# Patient Record
Sex: Female | Born: 1935 | Race: White | Hispanic: No | Marital: Married
Health system: Southern US, Community
[De-identification: ages and names within clinical notes are randomized; demographics above are authoritative.]

---

## 2003-05-26 ENCOUNTER — Other Ambulatory Visit: Payer: Self-pay

## 2004-02-01 ENCOUNTER — Ambulatory Visit: Payer: Self-pay | Admitting: Oncology

## 2004-05-04 ENCOUNTER — Ambulatory Visit: Payer: Self-pay

## 2004-05-09 ENCOUNTER — Ambulatory Visit: Payer: Self-pay | Admitting: Oncology

## 2004-05-27 ENCOUNTER — Ambulatory Visit: Payer: Self-pay | Admitting: General Surgery

## 2004-11-07 ENCOUNTER — Ambulatory Visit: Payer: Self-pay | Admitting: Oncology

## 2004-11-22 ENCOUNTER — Ambulatory Visit: Payer: Self-pay

## 2004-12-08 ENCOUNTER — Ambulatory Visit: Payer: Self-pay | Admitting: Ophthalmology

## 2004-12-13 ENCOUNTER — Ambulatory Visit: Payer: Self-pay | Admitting: Ophthalmology

## 2005-05-09 ENCOUNTER — Ambulatory Visit: Payer: Self-pay | Admitting: Oncology

## 2005-05-18 ENCOUNTER — Ambulatory Visit: Payer: Self-pay | Admitting: Oncology

## 2005-06-15 ENCOUNTER — Ambulatory Visit: Payer: Self-pay | Admitting: Oncology

## 2005-11-28 ENCOUNTER — Ambulatory Visit: Payer: Self-pay | Admitting: Oncology

## 2006-01-03 ENCOUNTER — Ambulatory Visit: Payer: Self-pay | Admitting: General Practice

## 2006-01-03 ENCOUNTER — Other Ambulatory Visit: Payer: Self-pay

## 2006-01-05 ENCOUNTER — Ambulatory Visit: Payer: Self-pay | Admitting: General Practice

## 2006-05-29 ENCOUNTER — Ambulatory Visit: Payer: Self-pay | Admitting: Oncology

## 2006-08-08 ENCOUNTER — Ambulatory Visit: Payer: Self-pay | Admitting: Oncology

## 2006-10-18 ENCOUNTER — Ambulatory Visit: Payer: Self-pay | Admitting: Internal Medicine

## 2006-10-20 ENCOUNTER — Ambulatory Visit: Payer: Self-pay | Admitting: Emergency Medicine

## 2006-12-17 ENCOUNTER — Ambulatory Visit: Payer: Self-pay | Admitting: Oncology

## 2007-01-11 ENCOUNTER — Ambulatory Visit: Payer: Self-pay | Admitting: Oncology

## 2007-01-16 ENCOUNTER — Ambulatory Visit: Payer: Self-pay | Admitting: Oncology

## 2007-02-02 ENCOUNTER — Ambulatory Visit: Payer: Self-pay | Admitting: Internal Medicine

## 2007-07-17 ENCOUNTER — Ambulatory Visit: Payer: Self-pay | Admitting: Oncology

## 2007-07-18 ENCOUNTER — Ambulatory Visit: Payer: Self-pay | Admitting: Oncology

## 2007-08-16 ENCOUNTER — Ambulatory Visit: Payer: Self-pay | Admitting: Oncology

## 2007-12-10 ENCOUNTER — Encounter: Admission: RE | Admit: 2007-12-10 | Discharge: 2007-12-10 | Payer: Self-pay | Admitting: Neurology

## 2007-12-24 ENCOUNTER — Ambulatory Visit: Payer: Self-pay | Admitting: Oncology

## 2008-01-16 ENCOUNTER — Ambulatory Visit: Payer: Self-pay | Admitting: Oncology

## 2008-02-14 ENCOUNTER — Ambulatory Visit: Payer: Self-pay | Admitting: Oncology

## 2009-03-14 ENCOUNTER — Ambulatory Visit: Payer: Self-pay | Admitting: Internal Medicine

## 2009-04-15 ENCOUNTER — Ambulatory Visit: Payer: Self-pay | Admitting: Oncology

## 2009-04-17 ENCOUNTER — Ambulatory Visit: Payer: Self-pay | Admitting: Oncology

## 2009-05-06 ENCOUNTER — Ambulatory Visit: Payer: Self-pay | Admitting: Oncology

## 2009-05-18 ENCOUNTER — Ambulatory Visit: Payer: Self-pay | Admitting: Oncology

## 2009-11-12 ENCOUNTER — Ambulatory Visit: Payer: Self-pay

## 2009-11-28 ENCOUNTER — Ambulatory Visit: Payer: Self-pay | Admitting: Internal Medicine

## 2010-02-17 ENCOUNTER — Ambulatory Visit: Payer: Self-pay

## 2010-04-19 ENCOUNTER — Ambulatory Visit: Payer: Self-pay | Admitting: Oncology

## 2011-03-15 ENCOUNTER — Inpatient Hospital Stay: Payer: Self-pay | Admitting: Internal Medicine

## 2011-03-18 ENCOUNTER — Ambulatory Visit: Payer: Self-pay | Admitting: Internal Medicine

## 2011-04-18 ENCOUNTER — Ambulatory Visit: Payer: Self-pay | Admitting: Internal Medicine

## 2011-04-26 ENCOUNTER — Ambulatory Visit: Payer: Self-pay | Admitting: Surgery

## 2011-05-16 ENCOUNTER — Inpatient Hospital Stay: Payer: Self-pay | Admitting: Internal Medicine

## 2011-05-16 LAB — URINALYSIS, COMPLETE
Bilirubin,UR: NEGATIVE
Blood: NEGATIVE
Hyaline Cast: 1
Ph: 5 (ref 4.5–8.0)
Protein: NEGATIVE
RBC,UR: <1 /HPF (ref 0–5)

## 2011-05-16 LAB — CBC WITH DIFFERENTIAL/PLATELET
Eosinophil %: 0 %
Lymphocyte %: 4.2 %
Monocyte %: 6.9 %
Neutrophil %: 88.8 %
Platelet: 280 10*3/uL (ref 150–440)
RBC: 4.29 10*6/uL (ref 3.80–5.20)
WBC: 28.3 10*3/uL — ABNORMAL HIGH (ref 3.6–11.0)

## 2011-05-16 LAB — BASIC METABOLIC PANEL
Anion Gap: 9 (ref 7–16)
BUN: 12 mg/dL (ref 7–18)
Co2: 31 mmol/L (ref 21–32)
EGFR (Non-African Amer.): 54 — ABNORMAL LOW
Glucose: 105 mg/dL — ABNORMAL HIGH (ref 65–99)

## 2011-05-17 LAB — CBC WITH DIFFERENTIAL/PLATELET
Basophil #: 0.1 10*3/uL (ref 0.0–0.1)
Eosinophil #: 0.2 10*3/uL (ref 0.0–0.7)
Eosinophil %: 1.4 %
Lymphocyte #: 1 10*3/uL (ref 1.0–3.6)
MCH: 29 pg (ref 26.0–34.0)
MCHC: 32.4 g/dL (ref 32.0–36.0)
Monocyte #: 0.9 10*3/uL — ABNORMAL HIGH (ref 0.0–0.7)
Neutrophil %: 86.8 %
Platelet: 227 10*3/uL (ref 150–440)
RBC: 3.65 10*6/uL — ABNORMAL LOW (ref 3.80–5.20)
RDW: 15.2 % — ABNORMAL HIGH (ref 11.5–14.5)

## 2011-05-17 LAB — LIPID PANEL
Cholesterol: 79 mg/dL (ref 0–200)
HDL Cholesterol: 20 mg/dL — ABNORMAL LOW (ref 40–60)
VLDL Cholesterol, Calc: 10 mg/dL (ref 5–40)

## 2011-05-17 LAB — BASIC METABOLIC PANEL
Anion Gap: 9 (ref 7–16)
BUN: 9 mg/dL (ref 7–18)
Calcium, Total: 7.1 mg/dL — ABNORMAL LOW (ref 8.5–10.1)
Chloride: 109 mmol/L — ABNORMAL HIGH (ref 98–107)
Co2: 25 mmol/L (ref 21–32)
Osmolality: 282 (ref 275–301)
Potassium: 2.5 mmol/L — CL (ref 3.5–5.1)

## 2011-05-17 LAB — MAGNESIUM: Magnesium: 1.4 mg/dL — ABNORMAL LOW

## 2011-05-18 LAB — COMPREHENSIVE METABOLIC PANEL
Albumin: 1.6 g/dL — ABNORMAL LOW (ref 3.4–5.0)
Alkaline Phosphatase: 54 U/L (ref 50–136)
Anion Gap: 9 (ref 7–16)
BUN: 6 mg/dL — ABNORMAL LOW (ref 7–18)
Calcium, Total: 7 mg/dL — CL (ref 8.5–10.1)
Glucose: 75 mg/dL (ref 65–99)
SGOT(AST): 15 U/L (ref 15–37)
SGPT (ALT): 9 U/L — ABNORMAL LOW
Total Protein: 4.2 g/dL — ABNORMAL LOW (ref 6.4–8.2)

## 2011-05-18 LAB — CBC WITH DIFFERENTIAL/PLATELET
Basophil #: 0.1 x10 3/mm 3
Basophil %: 0.8 %
Eosinophil #: 0.4 x10 3/mm 3
Eosinophil %: 3.7 %
HCT: 33.9 % — ABNORMAL LOW
HGB: 11.1 g/dL — ABNORMAL LOW
Lymphocyte %: 8.2 %
Lymphs Abs: 0.9 x10 3/mm 3 — ABNORMAL LOW
MCH: 29.2 pg
MCHC: 32.7 g/dL
MCV: 90 fL
Monocyte #: 0.7 x10 3/mm 3
Monocyte %: 6.4 %
Neutrophil #: 9.3 x10 3/mm 3 — ABNORMAL HIGH
Neutrophil %: 80.9 %
Platelet: 271 x10 3/mm 3
RBC: 3.79 X10 6/mm 3 — ABNORMAL LOW
RDW: 15.2 % — ABNORMAL HIGH
WBC: 11.5 x10 3/mm 3 — ABNORMAL HIGH

## 2011-05-18 LAB — PROT IMMUNOELECTROPHORES(ARMC)

## 2011-05-19 ENCOUNTER — Ambulatory Visit: Payer: Self-pay | Admitting: Internal Medicine

## 2011-05-19 LAB — CBC WITH DIFFERENTIAL/PLATELET
Basophil #: 0.3 10*3/uL — ABNORMAL HIGH (ref 0.0–0.1)
Eosinophil %: 4.4 %
HCT: 33.6 % — ABNORMAL LOW (ref 35.0–47.0)
Lymphocyte #: 0.9 10*3/uL — ABNORMAL LOW (ref 1.0–3.6)
Lymphocyte %: 9.6 %
MCV: 89 fL (ref 80–100)
Monocyte %: 5.5 %
Neutrophil #: 7.4 10*3/uL — ABNORMAL HIGH (ref 1.4–6.5)
RBC: 3.77 10*6/uL — ABNORMAL LOW (ref 3.80–5.20)
RDW: 15.4 % — ABNORMAL HIGH (ref 11.5–14.5)
WBC: 9.6 10*3/uL (ref 3.6–11.0)

## 2011-05-19 LAB — BASIC METABOLIC PANEL
Anion Gap: 12 (ref 7–16)
BUN: 2 mg/dL — ABNORMAL LOW (ref 7–18)
Chloride: 112 mmol/L — ABNORMAL HIGH (ref 98–107)
Co2: 22 mmol/L (ref 21–32)
Creatinine: 0.53 mg/dL — ABNORMAL LOW (ref 0.60–1.30)
EGFR (African American): >60
EGFR (Non-African Amer.): >60
Potassium: 3 mmol/L — ABNORMAL LOW (ref 3.5–5.1)
Sodium: 146 mmol/L — ABNORMAL HIGH (ref 136–145)

## 2011-05-19 LAB — MAGNESIUM: Magnesium: 1.4 mg/dL — ABNORMAL LOW

## 2011-05-20 LAB — MAGNESIUM: Magnesium: 1.5 mg/dL — ABNORMAL LOW

## 2011-05-20 LAB — CBC WITH DIFFERENTIAL/PLATELET
Basophil #: 0.3 10*3/uL — ABNORMAL HIGH (ref 0.0–0.1)
Basophil %: 3.5 %
Eosinophil #: 0.5 10*3/uL (ref 0.0–0.7)
Lymphocyte #: 1.2 10*3/uL (ref 1.0–3.6)
MCHC: 32.6 g/dL (ref 32.0–36.0)
Monocyte %: 7.6 %
Neutrophil #: 5.2 10*3/uL (ref 1.4–6.5)
RDW: 15.5 % — ABNORMAL HIGH (ref 11.5–14.5)

## 2011-05-20 LAB — BASIC METABOLIC PANEL
Anion Gap: 6 — ABNORMAL LOW (ref 7–16)
Calcium, Total: 7.3 mg/dL — ABNORMAL LOW (ref 8.5–10.1)
Chloride: 109 mmol/L — ABNORMAL HIGH (ref 98–107)
EGFR (Non-African Amer.): >60
Osmolality: 281 (ref 275–301)
Potassium: 3.4 mmol/L — ABNORMAL LOW (ref 3.5–5.1)

## 2011-05-21 LAB — COMPREHENSIVE METABOLIC PANEL
Alkaline Phosphatase: 48 U/L — ABNORMAL LOW (ref 50–136)
Anion Gap: 10 (ref 7–16)
Calcium, Total: 7.3 mg/dL — ABNORMAL LOW (ref 8.5–10.1)
Chloride: 109 mmol/L — ABNORMAL HIGH (ref 98–107)
Co2: 27 mmol/L (ref 21–32)
EGFR (African American): >60
EGFR (Non-African Amer.): >60
Sodium: 146 mmol/L — ABNORMAL HIGH (ref 136–145)

## 2011-05-21 LAB — CBC WITH DIFFERENTIAL/PLATELET
Basophil #: 0.3 10*3/uL — ABNORMAL HIGH (ref 0.0–0.1)
Eosinophil #: 0.4 10*3/uL (ref 0.0–0.7)
Lymphocyte %: 12.9 %
MCH: 29.1 pg (ref 26.0–34.0)
MCHC: 32.8 g/dL (ref 32.0–36.0)
MCV: 89 fL (ref 80–100)
Monocyte #: 0.8 10*3/uL — ABNORMAL HIGH (ref 0.0–0.7)
Neutrophil #: 6.4 10*3/uL (ref 1.4–6.5)
Neutrophil %: 70.7 %
RDW: 15.8 % — ABNORMAL HIGH (ref 11.5–14.5)
WBC: 9.1 10*3/uL (ref 3.6–11.0)

## 2011-05-22 LAB — BASIC METABOLIC PANEL
Anion Gap: 7 (ref 7–16)
BUN: 1 mg/dL — ABNORMAL LOW (ref 7–18)
Calcium, Total: 7.4 mg/dL — ABNORMAL LOW (ref 8.5–10.1)
Creatinine: 0.63 mg/dL (ref 0.60–1.30)
EGFR (Non-African Amer.): >60
Glucose: 120 mg/dL — ABNORMAL HIGH (ref 65–99)
Osmolality: 289 (ref 275–301)
Potassium: 3.7 mmol/L (ref 3.5–5.1)
Sodium: 147 mmol/L — ABNORMAL HIGH (ref 136–145)

## 2011-05-22 LAB — CBC WITH DIFFERENTIAL/PLATELET
Basophil %: 2.5 %
Eosinophil #: 0.6 10*3/uL (ref 0.0–0.7)
Eosinophil %: 9.2 %
HCT: 30.6 % — ABNORMAL LOW (ref 35.0–47.0)
HGB: 10 g/dL — ABNORMAL LOW (ref 12.0–16.0)
Lymphocyte %: 20.1 %
MCHC: 32.8 g/dL (ref 32.0–36.0)
MCV: 89 fL (ref 80–100)
Neutrophil #: 3.6 10*3/uL (ref 1.4–6.5)
RBC: 3.44 10*6/uL — ABNORMAL LOW (ref 3.80–5.20)
WBC: 6.4 10*3/uL (ref 3.6–11.0)

## 2011-05-23 LAB — BASIC METABOLIC PANEL
Anion Gap: 9 (ref 7–16)
BUN: 1 mg/dL — ABNORMAL LOW (ref 7–18)
EGFR (African American): >60
EGFR (Non-African Amer.): >60
Glucose: 104 mg/dL — ABNORMAL HIGH (ref 65–99)
Osmolality: 285 (ref 275–301)

## 2011-05-23 LAB — CBC WITH DIFFERENTIAL/PLATELET
Basophil #: 0.3 10*3/uL — ABNORMAL HIGH (ref 0.0–0.1)
Basophil %: 3.8 %
Eosinophil #: 0.6 10*3/uL (ref 0.0–0.7)
HCT: 33.7 % — ABNORMAL LOW (ref 35.0–47.0)
Lymphocyte %: 20.7 %
MCH: 28.8 pg (ref 26.0–34.0)
MCHC: 32.4 g/dL (ref 32.0–36.0)
MCV: 89 fL (ref 80–100)
Monocyte #: 0.9 10*3/uL — ABNORMAL HIGH (ref 0.0–0.7)
Neutrophil #: 3.6 10*3/uL (ref 1.4–6.5)
RDW: 16.1 % — ABNORMAL HIGH (ref 11.5–14.5)

## 2011-05-24 LAB — CBC WITH DIFFERENTIAL/PLATELET
Basophil #: 0.3 10*3/uL — ABNORMAL HIGH (ref 0.0–0.1)
HCT: 32.6 % — ABNORMAL LOW (ref 35.0–47.0)
HGB: 10.4 g/dL — ABNORMAL LOW (ref 12.0–16.0)
Lymphocyte #: 1.4 10*3/uL (ref 1.0–3.6)
MCHC: 32 g/dL (ref 32.0–36.0)
MCV: 89 fL (ref 80–100)
Monocyte #: 0.9 10*3/uL — ABNORMAL HIGH (ref 0.0–0.7)
Neutrophil #: 3.7 10*3/uL (ref 1.4–6.5)
RDW: 16.3 % — ABNORMAL HIGH (ref 11.5–14.5)
WBC: 6.8 10*3/uL (ref 3.6–11.0)

## 2011-05-24 LAB — BASIC METABOLIC PANEL
Chloride: 107 mmol/L (ref 98–107)
Co2: 29 mmol/L (ref 21–32)
Creatinine: 0.62 mg/dL (ref 0.60–1.30)
Potassium: 3.1 mmol/L — ABNORMAL LOW (ref 3.5–5.1)
Sodium: 146 mmol/L — ABNORMAL HIGH (ref 136–145)

## 2011-05-25 LAB — BASIC METABOLIC PANEL
Calcium, Total: 7.7 mg/dL — ABNORMAL LOW (ref 8.5–10.1)
Co2: 31 mmol/L (ref 21–32)
Glucose: 101 mg/dL — ABNORMAL HIGH (ref 65–99)
Osmolality: 279 (ref 275–301)

## 2011-05-25 LAB — CBC WITH DIFFERENTIAL/PLATELET
Basophil #: 0.2 10*3/uL — ABNORMAL HIGH (ref 0.0–0.1)
Basophil %: 3.2 %
Eosinophil #: 0.4 10*3/uL (ref 0.0–0.7)
Eosinophil %: 5.6 %
HCT: 33.4 % — ABNORMAL LOW (ref 35.0–47.0)
HGB: 11 g/dL — ABNORMAL LOW (ref 12.0–16.0)
Lymphocyte #: 1.2 10*3/uL (ref 1.0–3.6)
MCH: 29 pg (ref 26.0–34.0)
MCHC: 32.8 g/dL (ref 32.0–36.0)
MCV: 89 fL (ref 80–100)
Monocyte #: 0.9 10*3/uL — ABNORMAL HIGH (ref 0.0–0.7)
Neutrophil #: 4.8 10*3/uL (ref 1.4–6.5)
Neutrophil %: 63.7 %

## 2011-06-16 ENCOUNTER — Ambulatory Visit: Payer: Self-pay | Admitting: Internal Medicine

## 2011-07-17 DEATH — deceased

## 2013-10-14 IMAGING — CR DG CHEST 1V PORT
1 series · 1 of 1 positions shown · non-contrast
Comparison: none

REASON FOR EXAM: preop eval
COMMENTS:

[portable]
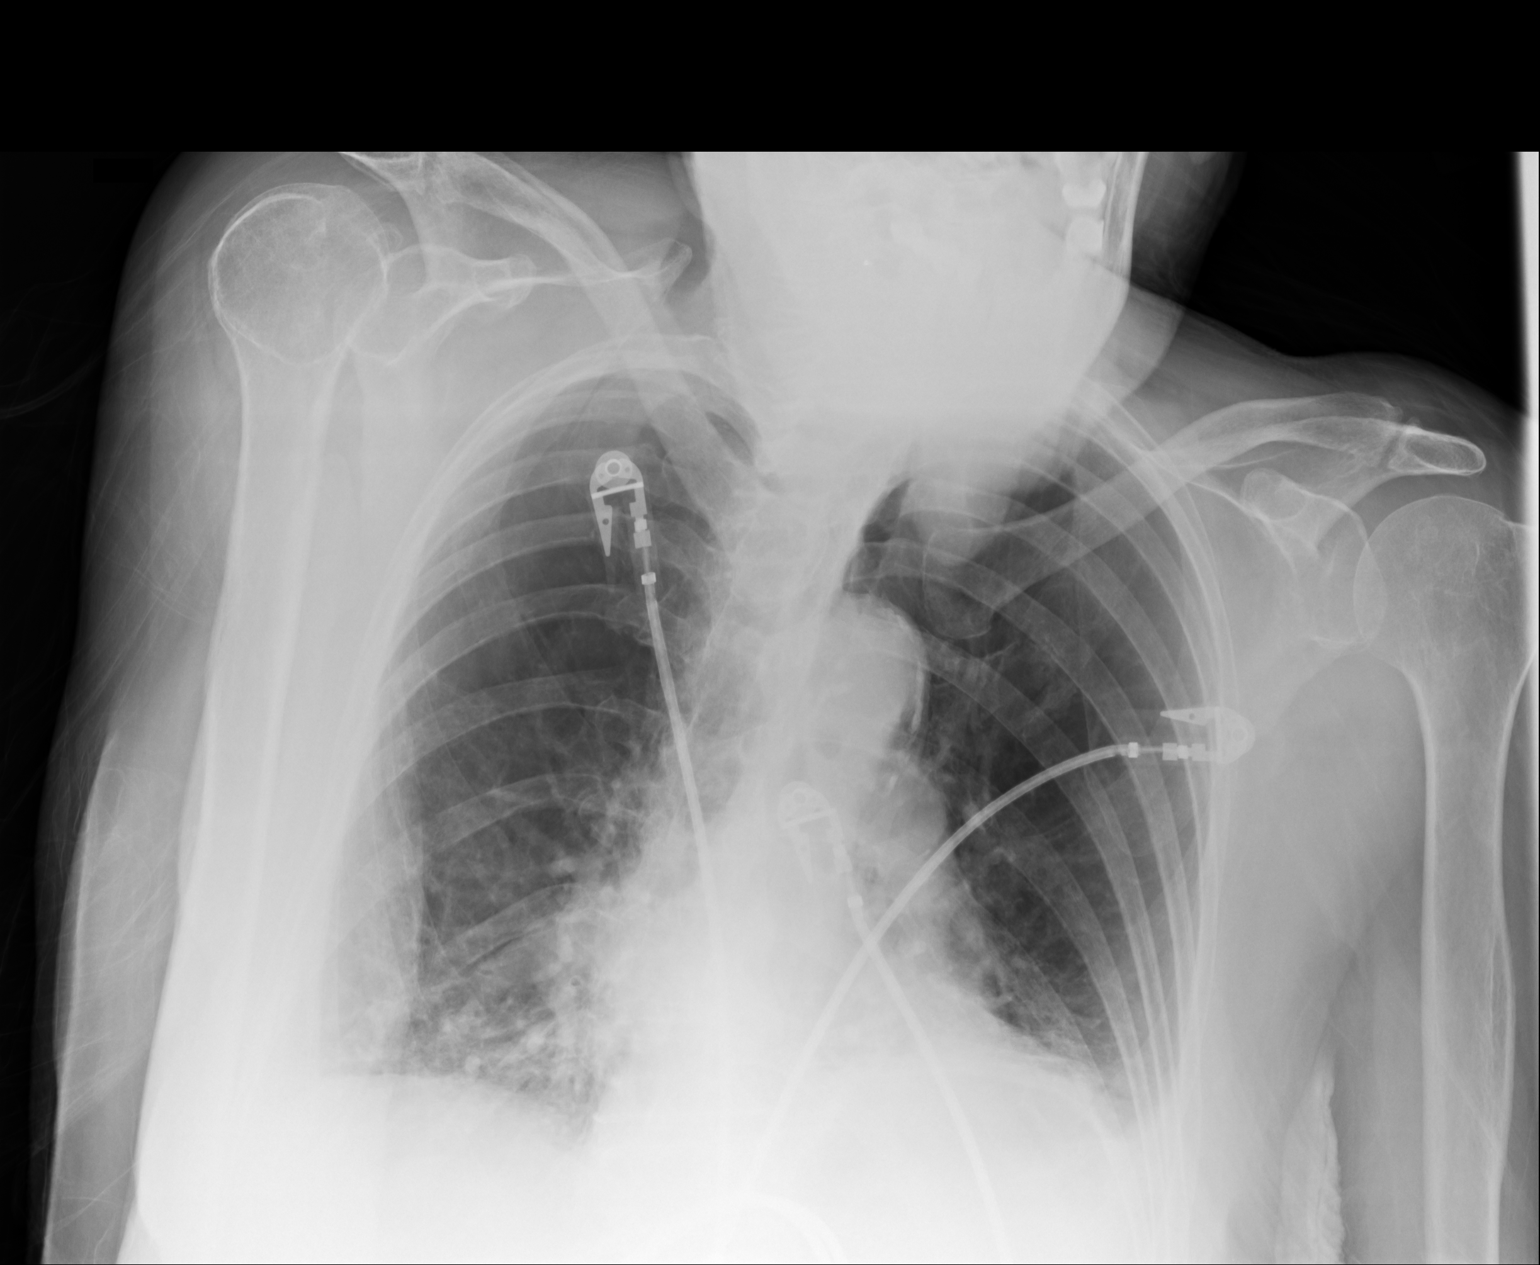

[1 of 1 positions shown; findings below may reference images not displayed]

PROCEDURE:     DXR - DXR PORTABLE CHEST SINGLE VIEW  - March 15, 2011  [DATE]

RESULT:

Frontal view of the chest is performed.

The patient has taken a shallow inspiration. An area of increased density
projects within the periphery of the left lung base. The cardiac silhouette
is moderately enlarged. The visualized bony skeleton is unremarkable.
IMPRESSION: Atelectasis versus infiltrate, left lung base, with possible
small effusion.

## 2013-10-14 IMAGING — CT CT ABD-PELV W/O CM
1 of 3 series · 14 of 32 positions shown, 19 images · non-contrast
Comparison: none

REASON FOR EXAM: (1) severe diffuse abdominal pain; pt w dementia; need
stat study; (2) see above
COMMENTS:

[Series 2: 3mm soft tissue · axial · 0.66mm/px · z∈[-1032,-648]mm · 14 of 144 slices shown, 19 images]
[im 8/144  soft-tissue]
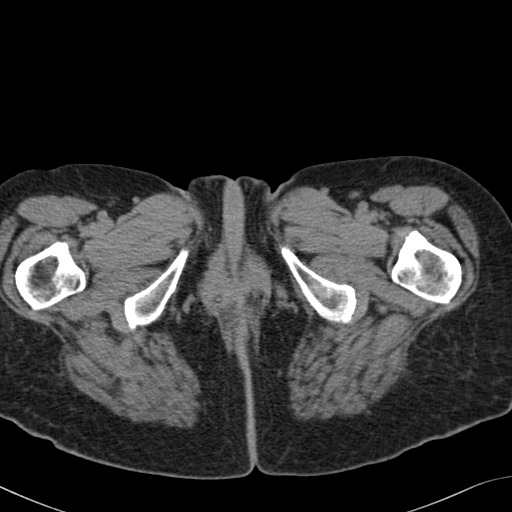
[im 8/144  bone]
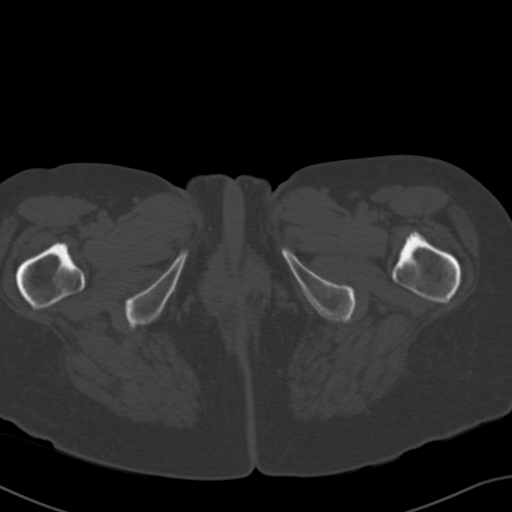
[im 23/144  soft-tissue]
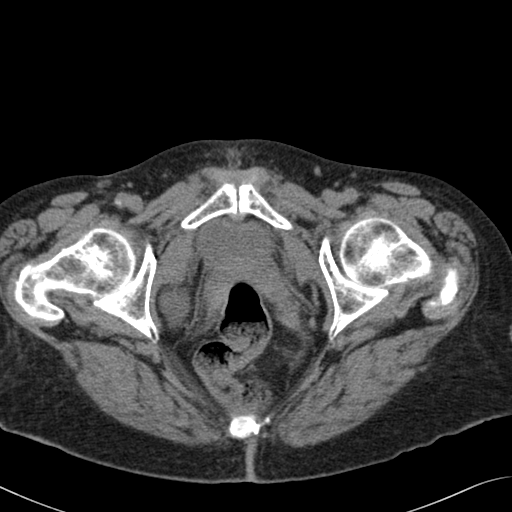
[im 31/144  soft-tissue]
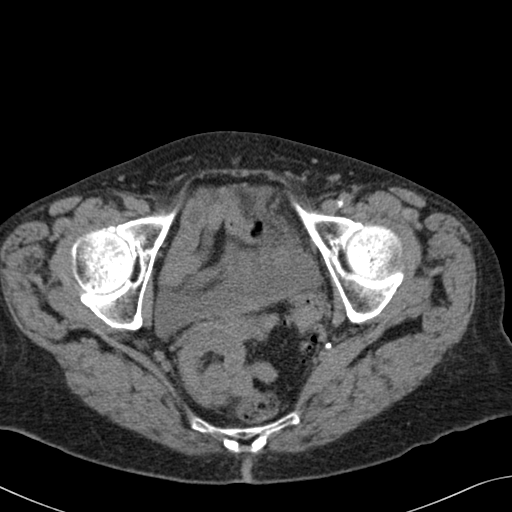
[im 38/144  soft-tissue]
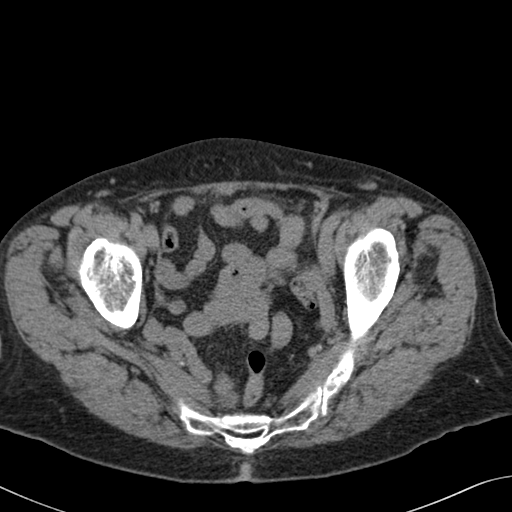
[im 53/144  soft-tissue]
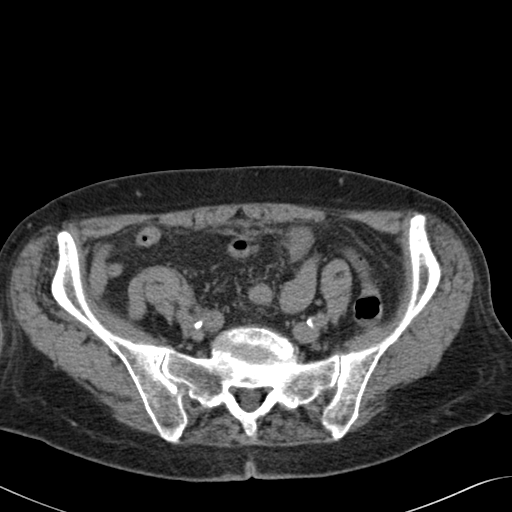
[im 61/144  soft-tissue]
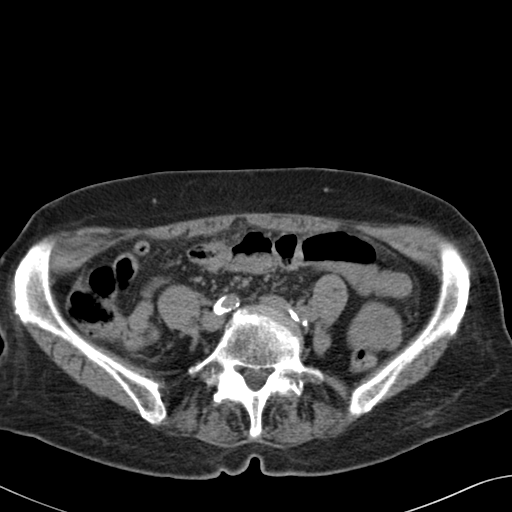
[im 76/144  soft-tissue]
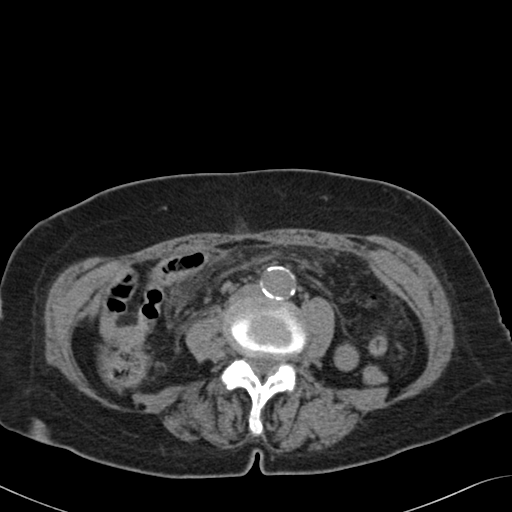
[im 83/144  soft-tissue]
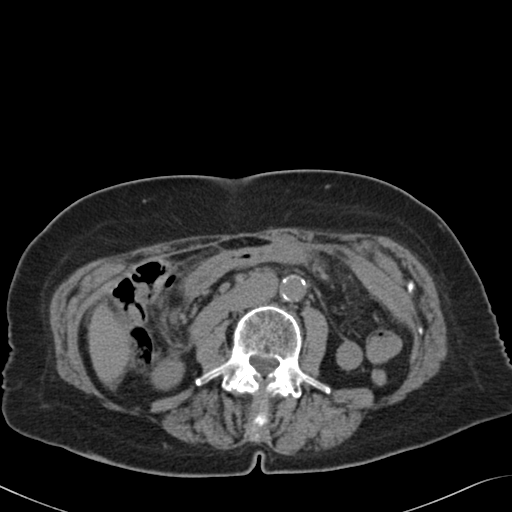
[im 91/144  soft-tissue]
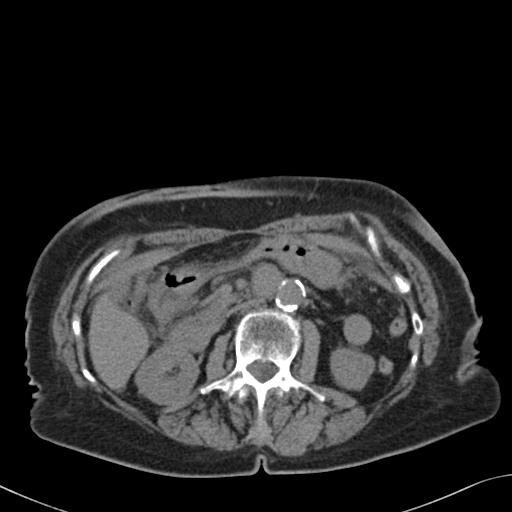
[im 91/144  bone]
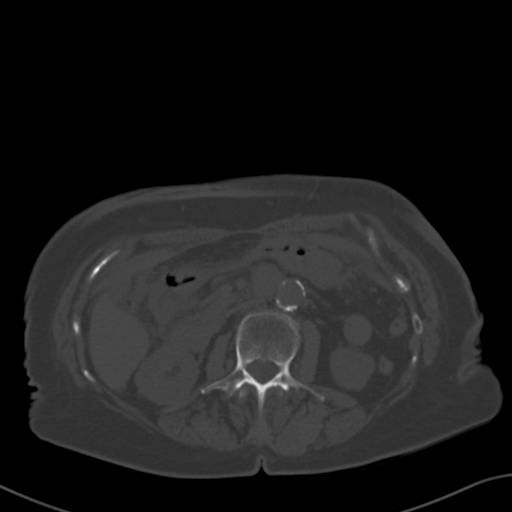
[im 106/144  soft-tissue]
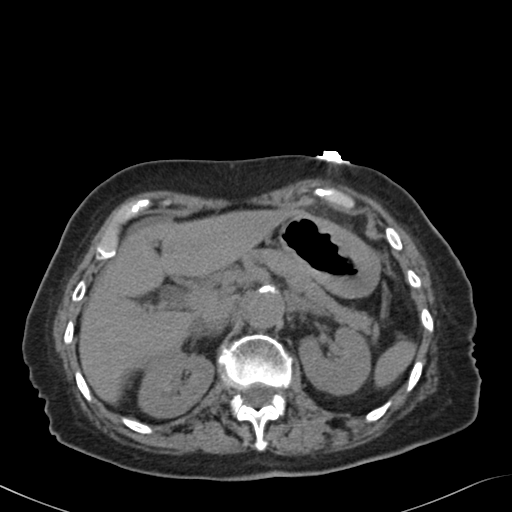
[im 113/144  soft-tissue]
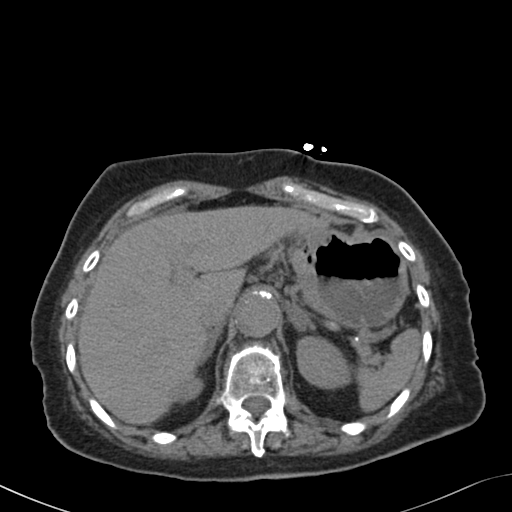
[im 113/144  lung]
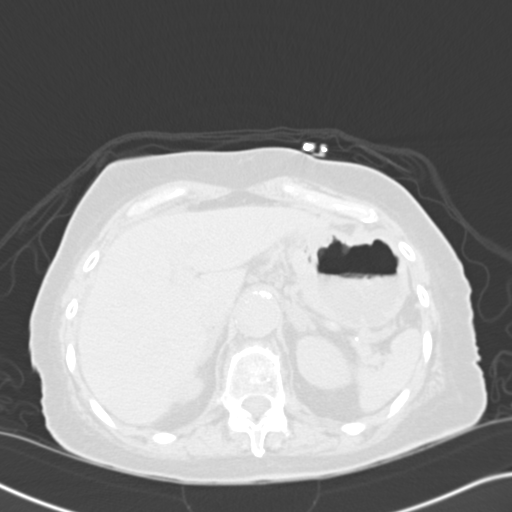
[im 121/144  soft-tissue]
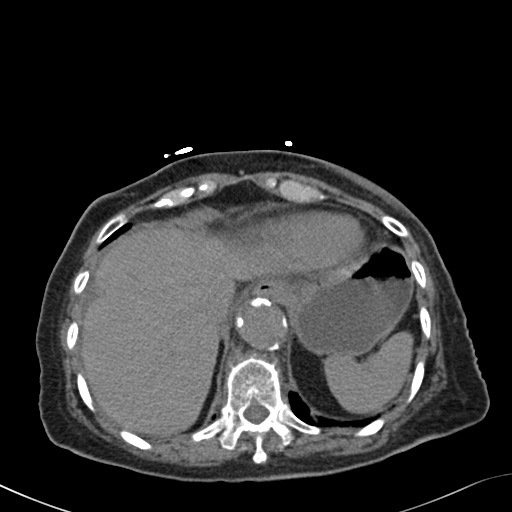
[im 121/144  lung]
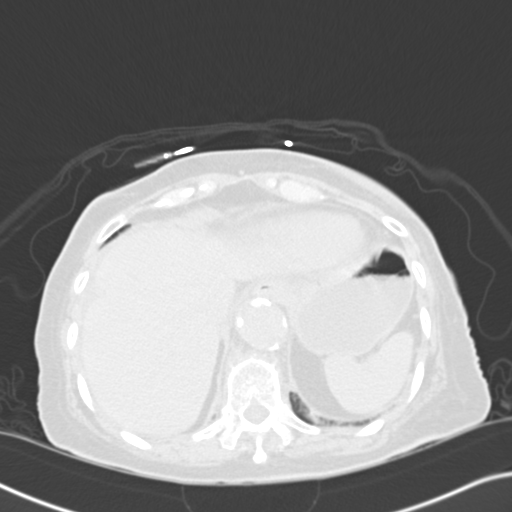
[im 128/144  lung]
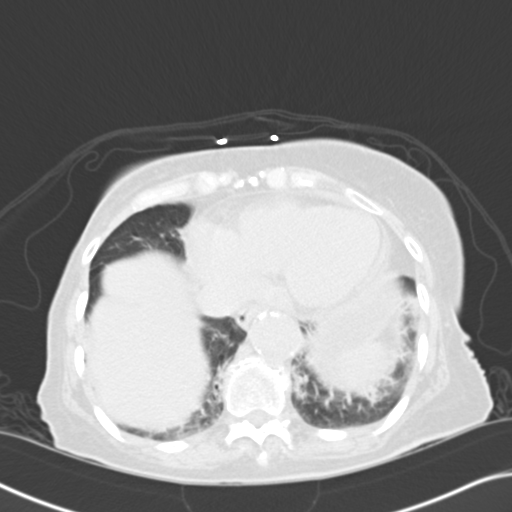
[im 136/144  soft-tissue]
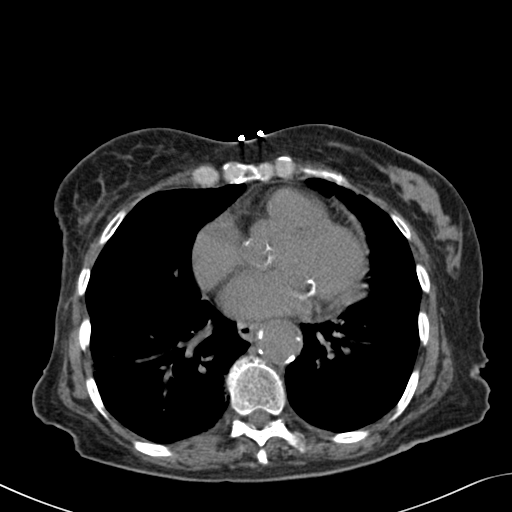
[im 136/144  lung]
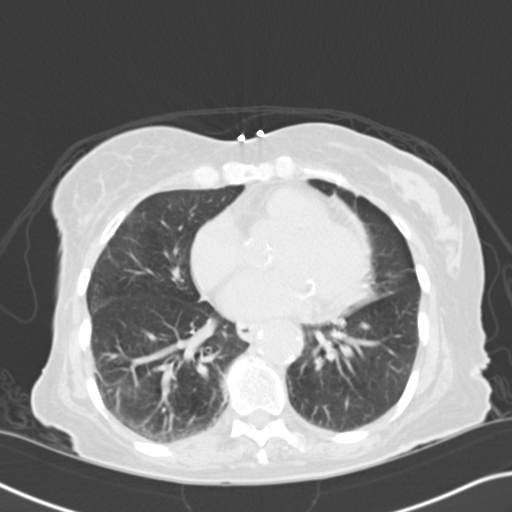

[14 of 32 positions shown; findings below may reference images not displayed]

PROCEDURE:     CT  - CT ABDOMEN AND PELVIS W[DATE]  [DATE]

RESULT:     Noncontrast CT of the abdomen and pelvis is reconstructed at
mm slice thickness in the axial plane. Is no previous exam for comparison.

Images through the lung bases demonstrate emphysematous lung disease with
areas of atelectasis and fibrosis. There is no focal consolidation or
significant pleural or pericardial effusion.

Atherosclerotic calcification is noted in the aortoiliac system. There is no
ascites or evidence of an abscess on this noncontrast exam. Colonic
diverticulosis is present. There appears to be a small amount of free air
medial to the anterior aspect of the hepatic flexure on images 60 through
62. There appears to be thickening of the wall of the duodenum. It is
suggested that this is from the upper GI system in the region of image 53
through 57 an anterior right mid abdomen. The appearance suggest perforated
ulcer likely in the proximal duodenum. The findings were discussed with the
requesting physician at the time of dictation. Prominent atherosclerotic
calcification in the aorta is present without evidence of aneurysm. The
kidneys show no stones or obstruction. The bony structures show DJD. The
pancreas appears unremarkable for noncontrast exam as do the liver and
spleen. The gallbladder is present without definite stones.
IMPRESSION: Inflammatory changes in the right upper quadrant with what
appears to be air tracking through the upper GI tract in the area of images
54 and 55 likely from perfect ulcer in the proximal duodenum.(*)

## 2013-10-15 IMAGING — CR DG CHEST 1V PORT
1 series · 1 of 1 positions shown · non-contrast
Comparison: none

REASON FOR EXAM: SOB
COMMENTS:

PROCEDURE:     DXR - DXR PORTABLE CHEST SINGLE VIEW  - March 16, 2011  [DATE]
RESULT:     Comparison: 03/15/2011

[portable]
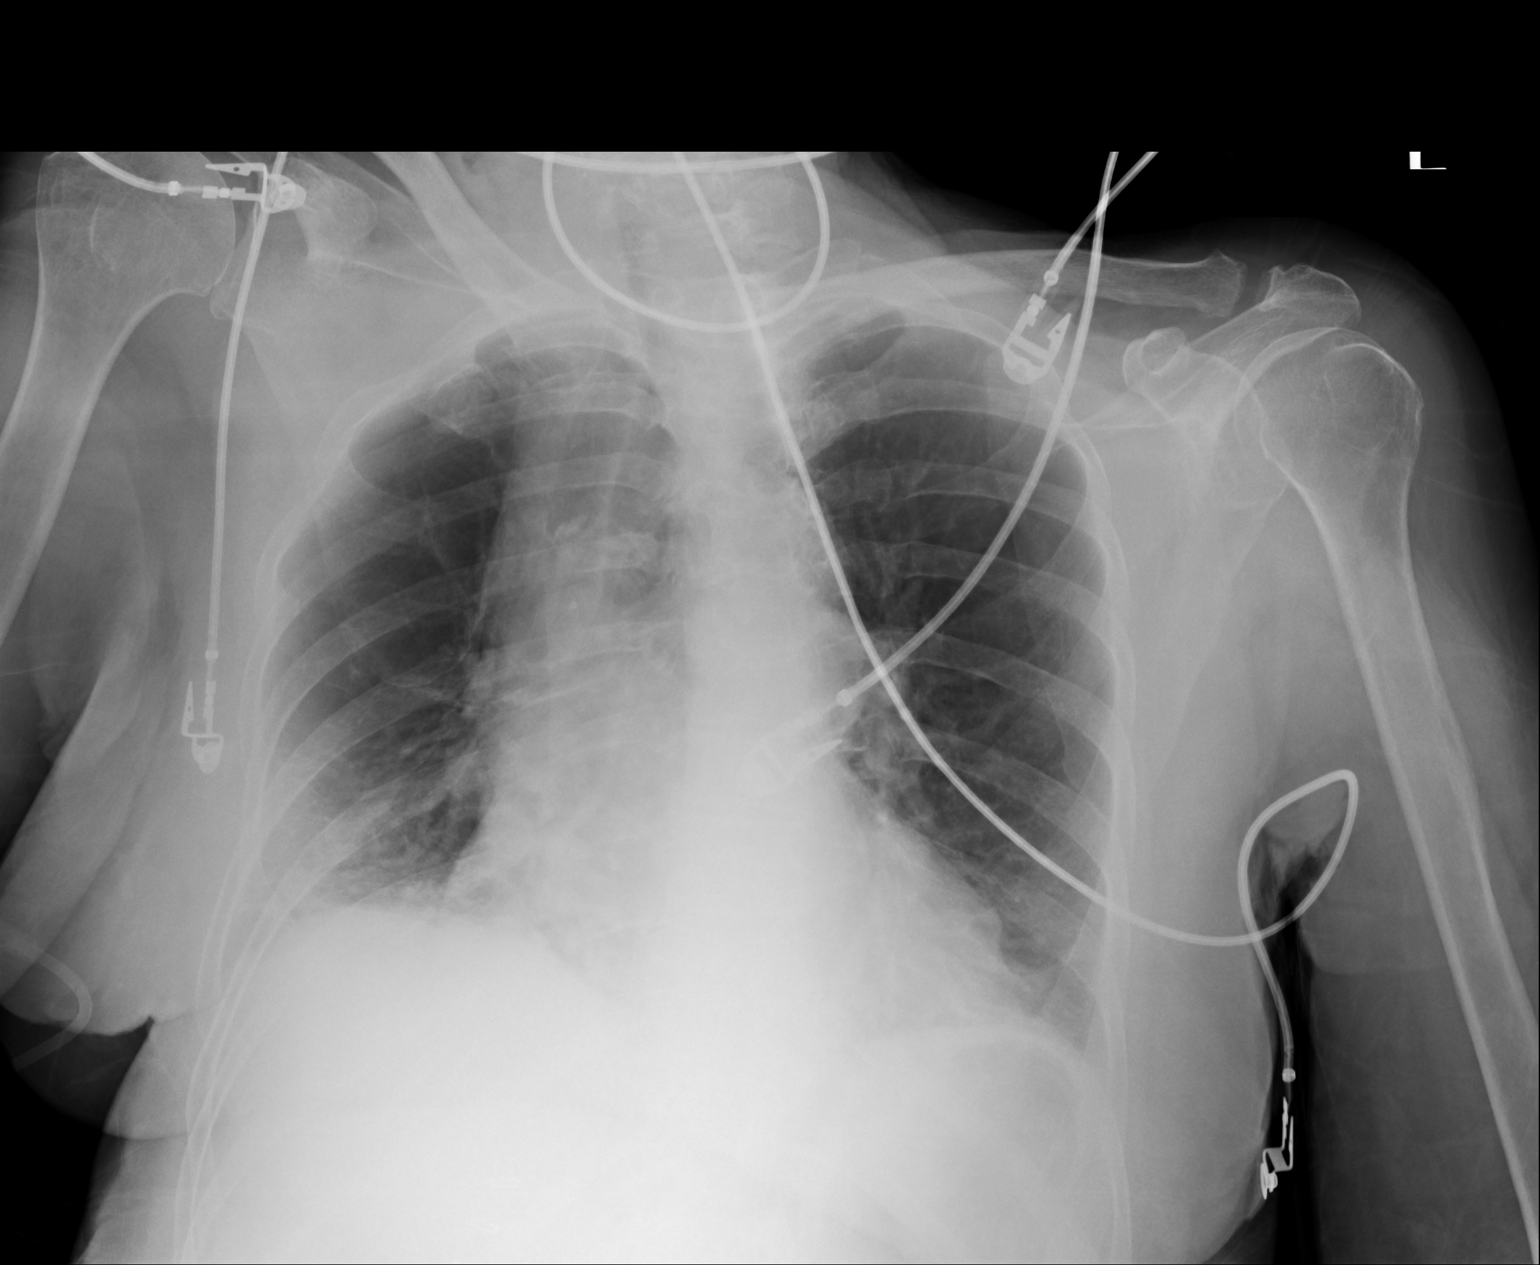

[1 of 1 positions shown; findings below may reference images not displayed]

FINDINGS: The patient is rotated, limiting evaluation. There mild bibasilar opacities
similar to prior. Heart and mediastinum are stable.
IMPRESSION: Unchanged basilar opacities. These may represent atelectasis or
infection/aspiration.

## 2013-10-18 IMAGING — CR DG CHEST 1V PORT
1 series · 1 of 1 positions shown · non-contrast
Comparison: none

REASON FOR EXAM: vent/follow up
COMMENTS:

PROCEDURE:     DXR - DXR PORTABLE CHEST SINGLE VIEW  - March 19, 2011  [DATE]
RESULT:     Comparison: 03/16/2011

[portable]
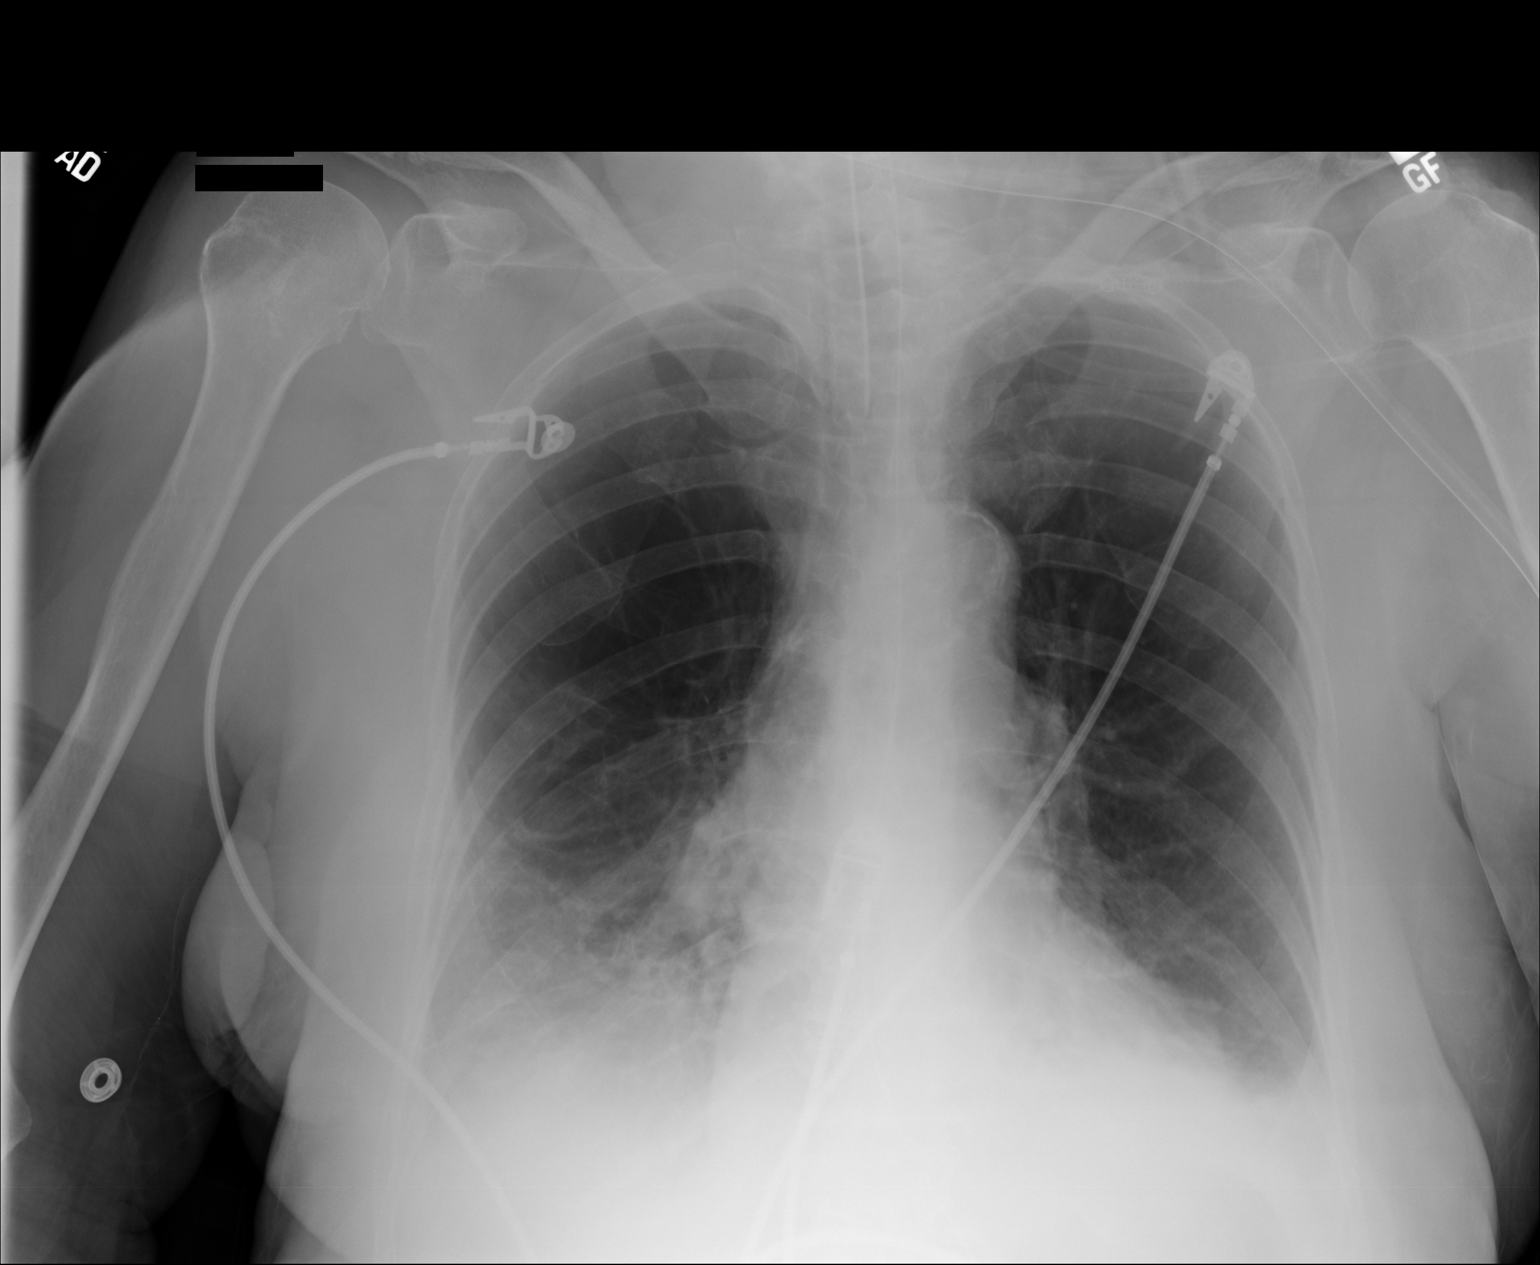

[1 of 1 positions shown; findings below may reference images not displayed]

FINDINGS: The endotracheal tube terminates at or just below the thoracic inlet.
Enteric tube courses subdiaphragmatically, beyond the field-of-view. The
lungs are hyperinflated. There are heterogeneous opacities in the lung base,
right greater left, which have increased from prior. Possible small
bilateral pleural effusions.
IMPRESSION: 1. Increased bibasilar opacities may represent infection or atelectasis.
2. Small bilateral pleural effusions.

## 2014-08-09 NOTE — Consult Note (Signed)
Pt without dirrhea today, passed a little green mucus. Pt not taking her Dificid today as she did yesterday.  Pt getting iv Flagyl still. Abd not tender nor distended.  No new recommendations. She needs 14 days of antibiotics for her C. diff.  Electronic Signatures: Scot JunElliott, Robert T (MD)  (Signed on 01-Feb-13 16:42)  Authored  Last Updated: 01-Feb-13 16:42 by Scot JunElliott, Robert T (MD)

## 2014-08-09 NOTE — Consult Note (Signed)
Pt with dementia, C. diff colitis, laying with right side down.  Has insp rales right side.  CXR showed air space disease, either pneumonia or atelectasis.  abd flat, non tender, no masses.  Will contact covering internist.  Electronic Signatures: Scot JunElliott, Natosha Bou T (MD)  (Signed on 03-Feb-13 13:44)  Authored  Last Updated: 03-Feb-13 13:44 by Scot JunElliott, Olympia Adelsberger T (MD)

## 2014-08-09 NOTE — H&P (Signed)
PATIENT NAME:  Veronica [name], Veronica [name] MR#:  [id number] DATE OF BIRTH:  12-Nov-1929  DATE OF ADMISSION:  05/16/2011  PRIMARY CARE PHYSICIAN: John B. Danne Harbor, MD, [facility]   EMERGENCY ROOM PHYSICIAN: Dr. Chiquita Loth    CHIEF COMPLAINT: Confusion, diarrhea, and abdominal pain.   HISTORY OF PRESENT ILLNESS: The patient is a 81 year old female who presents with chief complaint of confusion. Symptoms began yesterday. The patient has also been agitated. She has been recently started on Risperdal with some relief. The patient was recently diagnosed with urinary tract infection per nursing home records. She was treated with IV Rocephin. She has been weak. She has had poor appetite. She had an episode of vomiting yesterday. The patient's diarrhea has been persistent. There was no blood or mucus noted.   PAST MEDICAL HISTORY: 1. Profound dementia.  2. Chronic obstructive pulmonary disease. 3. Depression. 4. Anxiety.  5. Gastroesophageal reflux disease.  ALLERGIES: Penicillin, sulfa, Cipro, Avelox.   CURRENT MEDICATIONS:  1. Xanax 0.5 mg p.o. q.6 hours p.r.n.  2. Nitro-Bid 100 mg p.o. b.i.d. x10 days.  3. Risperdal 1 mg p.o. b.i.d.  4. Lorazepam 0.5 mg apply 1 mL to inner wrist 3 times daily for anxiety.  5. Albuterol 0.83% inhalers q.6 hours. 6. Xylocaine 2% q.i.d.  7. Citalopram 20 mg p.o. daily.  8. Protonix 40 mg p.o. daily.  9. Norvasc 10 mg p.o. daily. 10. Spiriva 18 mcg inhaler q.24 hours.  11. Trazodone 50 mg p.o. twice daily.  12. Advair 250/50 inhaler twice daily.  13. Risperdal 0.5 mg p.o. twice daily.  14. Remeron 15 mg p.o. nightly.   SOCIAL HISTORY: The patient is a resident of a skilled nursing facility. No history of tobacco abuse, alcohol abuse, or drug abuse.   FAMILY HISTORY: Unable to obtain due to the patient's mental status.  REVIEW OF SYSTEMS: Limited. CONSTITUTIONAL: Reports of low-grade fevers. No chills. No night sweats. HEENT: No dysphagia. No hearing loss,  visual problems. CARDIOVASCULAR: No chest pain, orthopnea, PND. RESPIRATORY: No wheezing or hemoptysis. GI: Please see history of present illness. GU: No hematuria, dysuria, or frequency. NEUROLOGIC: No reports of headache, focal weakness or seizures. SKIN: No reports of lesions or rash. ENDOCRINE: No polyuria, polyphagia, or polydipsia. MUSCULOSKELETAL: No arthralgias, myalgias, joint swelling, tenderness. HEMATOLOGICAL: No easy bleeding or bruises.   PHYSICAL EXAMINATION:   VITAL SIGNS: Temperature 99.2, heart rate 102, respiratory rate 20, blood pressure 113/46, oxygen sats 93%.   HEENT: Atraumatic, normocephalic. Pupils equal, round, and reactive to light and accommodation. Extraocular movements intact. Sclerae anicteric. Mucous membranes moist.   NECK: Supple. No organomegaly.   CARDIOVASCULAR: S1, S2, regular rate and rhythm. No gallops. No thrills. No murmurs.   RESPIRATORY: Lungs are clear to auscultation. No rales, no rhonchi, no wheezes, no bronchial breath sounds.   GI: Abdomen is soft, nontender, nondistended. Normal bowel sounds. No hepatosplenomegaly.   GU: There is no hematuria or masses noted.   SKIN: No lesions, no rash.   ENDOCRINE: No masses, no thyromegaly.   LYMPH: No lymphadenopathy or nodes palpable.   NEUROLOGIC: Cranial nerves II through XII grossly intact. Motor strength is 5/5 bilateral upper and lower extremities. Sensation within normal limits. No focal neurological deficits noted on examination.   MUSCULOSKELETAL: No arthritis, joint effusion, or swelling.   HEMATOLOGICAL: No ecchymosis, no bleeding, no petechiae.   EXTREMITIES: No cyanosis, no clubbing, no edema. 2+ pedal pulses bilaterally.  LABORATORY, DIAGNOSTIC, AND RADIOLOGICAL DATA: Stool for C. difficile is positive. Glucose  105, BUN 12, creatinine 1.04, sodium 140, potassium 3.4, chloride 100, CO2 31, calcium 7.5, estimated GFR 54, WBC count 28,300, hemoglobin 12.6, hematocrit 38.2, platelet 280,  MCV 89. Urinalysis is negative.   ASSESSMENT AND PLAN:  1. The patient is a 81 year old female who presents with chief complaint of diarrhea, low-grade fevers, and confusion consistent with sepsis. She recently underwent abdominal surgery. Her abdominal exam is benign. Will admit patient to telemetry unit. Start IV fluids. Start p.o. Flagyl. Check CT of the abdomen and pelvis.  2. Acute agitation. Continue p.r.n. Ativan and Risperdal.  3. Hypokalemia. Replace potassium. Recheck in the morning.  4. Chronic obstructive pulmonary disease. Continue Spiriva and Advair.  5. Gastroesophageal reflux disease. Continue Protonix.  6. Depression. Continue Paxil and citalopram.    ____________________________ Donia AstJignesh S. Briyan Kleven, MD jsp:drc D: 05/16/2011 04:29:33 ET T: 05/16/2011 09:39:27 ET JOB#: [id number]  cc: Donia AstJignesh S. Mahlani Berninger, MD, <Dictator>, Letta PateJohn B. Danne HarborWalker III, MD Donia AstJIGNESH S Kashaun Bebo MD ELECTRONICALLY SIGNED 05/16/2011 22:05

## 2014-08-09 NOTE — Consult Note (Signed)
Pt is elderly WF with C. diff colitis, positive stool, CT with diffuse thickened colon.  She was in hospital with duodenal ulcer perforation in 02/2011 with successful surgery.  Pt recently treated with Rocephin for UTI.  Pt has severe dementia.  Exam shows head atraumatic, tongue pink chest clear, heart RRR, abd not distended, few bowel sounds heard, no masses, no HSM. Ext trace edema.  WBC 28 and repeat 17, K low, being replaced. continue iv flagyl and increase to 500mg  iv qid.  Start oral Dificid 200mg  bid open capsule and place in Gatorade which she is drinking and getting her other meds.  Liquid vancomycin has a bad taste and she may not tolerate this.  Also check serum protein immuno electrophoresis to check for gamma globulin deficiency.  Will follow with you.  Electronic Signatures: Scot JunElliott, Robert T (MD)  (Signed on 30-Jan-13 12:56)  Authored  Last Updated: 30-Jan-13 12:56 by Scot JunElliott, Robert T (MD)

## 2014-08-09 NOTE — Consult Note (Signed)
Pt demented, on O2 with sat 92% on 2L, WBC down to 11.7, stools still loose, SPIE showed low albumin and low gamma globulin of 577 with normal above 700.  Recommend continue treatment, if taking Dificid well can stop the Flagyl. No other new suggestions.   Electronic Signatures: Scot JunElliott, Halim Surrette T (MD)  (Signed on 31-Jan-13 17:54)  Authored  Last Updated: 31-Jan-13 17:54 by Scot JunElliott, Leul Narramore T (MD)

## 2014-08-09 NOTE — Consult Note (Signed)
Pt seems a little more coherent, husband reports 2 stools, semiformed.  abd non tender. No new recommendations.  Electronic Signatures: Scot JunElliott, Donnella Morford T (MD)  (Signed on 04-Feb-13 17:33)  Authored  Last Updated: 04-Feb-13 17:33 by Scot JunElliott, Jamala Kohen T (MD)

## 2014-08-09 NOTE — H&P (Signed)
PATIENT NAME:  Veronica [name], Veronica [name] MR#:  [id number] DATE OF BIRTH:  1930/01/23  DATE OF ADMISSION:  05/16/2011  ADDENDUM  Leukocytosis due to Clostridium difficile colitis. Will monitor closely.    ____________________________ Donia AstJignesh S. Abbye Lao, MD jsp:cms D: 05/16/2011 04:34:49 ET [name]: 05/16/2011 09:30:00 ET JOB#: [id number]  cc: Donia AstJignesh S. Vandell Kun, MD, <Dictator> Donia AstJIGNESH S Zia Najera MD ELECTRONICALLY SIGNED 05/16/2011 22:05

## 2014-08-09 NOTE — Consult Note (Signed)
Pt is most coherent and following commands that I have seen her.  Pt husband does not want feeding tube and Dr. Dan HumphreysWalker and I agree.  I would treat her C. diff for 7-10 days longer than any other antibiotic  currently in use.  So for 7-10 days after antibiotic for lungs is finished.  Electronic Signatures: Scot JunElliott, Robert T (MD)  (Signed on 06-Feb-13 15:42)  Authored  Last Updated: 06-Feb-13 15:42 by Scot JunElliott, Robert T (MD)

## 2014-08-09 NOTE — Consult Note (Signed)
Palliative team talking to husband about PEG or Dubhoff tube for chronic nourishment and hydration.  I will wait on their decision for possible PEG tube.  Electronic Signatures: Scot JunElliott, Robert T (MD)  (Signed on 05-Feb-13 13:58)  Authored  Last Updated: 05-Feb-13 13:58 by Scot JunElliott, Robert T (MD)

## 2014-08-09 NOTE — Discharge Summary (Signed)
PATIENT NAME:  Veronica BarmanSMITH, Devri T MR#:  [id number] DATE OF BIRTH:  Sep 24, 1929  DATE OF ADMISSION:  05/16/2011 DATE OF DISCHARGE:  05/25/2011  ADDENDUM  Ms. Enderson's transfer to skilled nursing was delayed yesterday while waiting on insurance approval. In the interim, the consulting gastroenterologist had suggested that the patient be treated an additional seven days with IV Flagyl due to the fact she received IV Rocephin for her suspected respiratory infection. Therefore, though all her orders have been altered to read that she shall receive IV Flagyl 500 mg t.i.d. via the PICC line for an additional seven days.    ____________________________ Letta PateJohn B. Danne HarborWalker III, MD jbw:ap D: 05/25/2011 12:33:25 ET T: 05/25/2011 13:17:15 ET JOB#: [id number]  cc: Jonny RuizJohn B. Danne HarborWalker III, MD, <Dictator> Elmo PuttJOHN B WALKER III MD ELECTRONICALLY SIGNED 05/26/2011 8:29

## 2014-08-09 NOTE — Consult Note (Signed)
Pt with C. diff infection, spoke with Dr. Dan HumphreysWalker that she should get flagyl or other C. diff medicine for 7 days beyond what ever course of therapy she has for the small pulmonary  infiltrate.  This could be done with iv flagyl since she does not take oral meds consistently.  I will sign off.  I will be out of town tomorrow.   Dr. Bluford Kaufmannh will be on call tomorrow but will not see unless you call.  Electronic Signatures: Scot JunElliott, Hart Haas T (MD)  (Signed on 07-Feb-13 13:18)  Authored  Last Updated: 07-Feb-13 13:18 by Scot JunElliott, Jakwon Gayton T (MD)

## 2014-08-09 NOTE — Consult Note (Signed)
Talked to Dr. Bethann PunchesMark Miller and will order iv Rocephin for better coverage of her pneumonia on CXR.  Electronic Signatures: Scot JunElliott, Robert T (MD)  (Signed on 03-Feb-13 13:57)  Authored  Last Updated: 03-Feb-13 13:57 by Scot JunElliott, Robert T (MD)

## 2014-08-09 NOTE — Consult Note (Signed)
Pt has slight tenderness in LLQ, she is difficult to "read" due to dementia.  She is malnourished, not certain TPN indicated given overall situation.  No new suggestions.  Needs 14 days of treatment for C. diff.  Electronic Signatures: Scot JunElliott, Robert T (MD)  (Signed on 02-Feb-13 12:17)  Authored  Last Updated: 02-Feb-13 12:17 by Scot JunElliott, Robert T (MD)

## 2014-08-09 NOTE — Op Note (Signed)
PATIENT NAME:  [name], Veronica [name] MR#:  [id number] DATE OF BIRTH:  Sep 22, 1929  DATE OF PROCEDURE:  05/18/2011  PREOPERATIVE DIAGNOSES:  1. Dementia.  2. Clostridium difficile colitis.  3. Hypertension.  4. Severe protein malnutrition.  5. Poor venous access.   POSTOPERATIVE DIAGNOSES: 1. Dementia.  2. Clostridium difficile colitis.  3. Hypertension.  4. Severe protein malnutrition.  5. Poor venous access.   PROCEDURES: 1. Ultrasound guidance for vascular access, right basilic vein.  2. Fluoroscopic guidance for placement of catheter.  3. Placement of a peripherally inserted central venous catheter, right arm.   SURGEON: Annice NeedyJason S. Kaidyn Hernandes, MD    ASSISTANT: Gari Crownhristopher Gibbons, vascular technologist   ANESTHESIA: Local   INDICATION FOR PROCEDURE: This is a demented elderly white female with multiple above mentioned ongoing issues and very poor venous access and a PICC line was requested for these reasons for the initiation of IV medicines and fluids.   DESCRIPTION OF PROCEDURE: The patient was brought to the Vascular Interventional Radiology Suite and her right upper extremity was sterilely prepped and draped and a sterile surgical field was created. The right basilic vein was visualized with the ultrasound and found to be patent. It was accessed under direct ultrasound guidance with a micropuncture needle. An 0.018 wire was placed, sheath was placed over the wire, and a peripherally inserted central venous catheter was placed through the sheath over the wire and the wire and sheath were removed. It was placed in the superior vena cava and the catheter was secured at 37 cm with a sterile dressing. It withdrew blood well and flushed easily with heparinized saline.   ____________________________ Annice NeedyJason S. Mineola Duan, MD jsd:drc D: 05/18/2011 16:49:03 ET [name]: 05/19/2011 10:52:25 ET JOB#: [id number]  cc: Annice NeedyJason S. Nephtali Docken, MD, <Dictator> Annice NeedyJASON S Kallan Merrick MD ELECTRONICALLY SIGNED 06/05/2011 9:23

## 2014-08-09 NOTE — Consult Note (Signed)
Comments   Raquel Rey and I met with pt's family (husband and 2 daughters) to discuss medical goals. Family updated on pt's current medical status including poor po intake, not taking po medications, ongoing tx for c. difficile, etc. We discussed option of PEG for artificial nutrition in setting of severe malnutrition. Family tell us that patient has a living will but never had any discussions with family to indicate if she would want long term artificial nutrition. Family is torn if they would want a PEG (husband and one daughter are sure they would, one daughter is hesitant, family thinks son would want although he was not in attendance). Family seems to be in consensus that they would not want long term nutritional support but would want to try to artifical feedings at least for several months to see if patient may increase po intake with resolution of her acute illness. They understand that this may not happen and that we often see anorexia in advanced dementia. Will discuss with attending. GI following.  60 minutes  Electronic Signatures: Jomel Whittlesey, Kirt Boys (NP)  (Signed 05-Feb-13 16:24)  Authored: Palliative Care Phifer, Izora Gala (MD)  (Signed 05-Feb-13 22:31)  Authored: Palliative Care   Last Updated: 05-Feb-13 22:31 by Phifer, Izora Gala (MD)

## 2014-08-09 NOTE — Consult Note (Signed)
PATIENT NAME:  Veronica [name], Veronica [name] MR#:  [id number] DATE OF BIRTH:  [birth year]  DATE OF CONSULTATION:  05/17/2011  REFERRING PHYSICIAN:  Dr Candie Chroman CONSULTING PHYSICIAN:  Theodore Demark, NP  PRIMARY CARE PHYSICIAN: Dr. Hewitt Blade. Sarina Ser, [facility]; also is followed by Dr. Clemmie Krill at [facility].    HISTORY OF PRESENT ILLNESS: Ms. Olvera is a 81 year old Caucasian female who was admitted yesterday with confusion, diarrhea and abdominal pain. She has been found to have C. difficile with pancolitis and GI is being consulted at the request of Dr. Gilford Rile for the C. difficile colitis. Her history is significant for profound dementia, chronic obstructive pulmonary disease and gastroesophageal reflux disease as well as breast cancer. Her husband is at the bedside and he is providing details of her illness due to the patient's confusion. He tells me that she was admitted last November to December and had a duodenal ulcer repair. Once she was satisfactorily healed in the hospital she went to [facility] for some rehab. Since she has been there she has experienced decreased appetite and she began to have approximately two large loose diarrhea stools daily. This has continued for the last month. It started out to be black then turned yellow and became green. Husband tells me she has been treated with several antibiotics recently and has had no known sick contacts. She did not have diarrhea prior to going to [facility]. Her last normal bowel movement was approximately a month ago. There is no known stool testing at rehab. She did follow up with Dr. Pat Patrick for the surgery who husband states thought she had diverticulitis and put her on antibiotics. Additionally, per the physicians note she also received antibiotics for urinary tract infection. Her husband states that she has been overly sleepy lately. States there is no changes to her agitation, breathing or other general appearance. Since she  has been at the hospital she has had a CT that demonstrates pancolitis. Stool studies show Clostridium difficile diarrhea. She has been started on Flagyl 500 mg q.8 hours and fluid replacement therapy. Her potassium was found to be low this morning, was replaced earlier with 40 mEq of potassium chloride. Initially her white blood count was elevated at 28.3, today it has decreased to 17.1. Her admission hemoglobin was 12.6 and today was 10.6. She has a normal platelet count. Additionally, she has undergone colonoscopy in the past, last time being 2004 due to personal history of adenomatous polyps.   PAST MEDICAL HISTORY:  1. Profound dementia. 2. Chronic obstructive pulmonary disease, has seen Dr. Raul Del in the past. 3. Depression, anxiety. 4. Gastroesophageal reflux disease. 5. Breast cancer, underwent lumpectomy and chemotherapy at [facility], reported to be in remission at present.  6. Also significant for a questionable appendectomy.   ALLERGIES: Penicillin, sulfa, Cipro, Avelox. The allergies of Lipitor, Vigamox and redux are listed in the hospital system computer, however, per husband, Cortana Vanderford, is not aware that she is allergic to these things.   CURRENT MEDICATIONS WHILE IN HOSPITAL:  1. Acetaminophen 650 mg p.o. p.r.n.  2. Alprazolam 0.5 mg q.6h. p.r.n.  3. Norvasc 10 mg p.o. daily.  4. Celexa 20 mg p.o. daily.  5. Lovenox 40 mg subcutaneous daily. 6. Advair Diskus 250/50, 1 puff b.i.d.  7. Metronidazole 500 mg IV piggyback q.8 hours. 8. Remeron 15 mg p.o. at bedtime p.r.n.  9. Nystatin cream to the buttocks q.12 hours.  10. Zofran ODT 4 mg p.o. q.6 hours  p.r.n.  11. Pantoprazole 40 mg p.o. q.a.m.  12. Risperdal 0.5 mg p.o. b.i.d. 13. Tiotropium inhaler one cap inhalation daily.  14. Desyrel 50 mg p.o. b.i.d.   SOCIAL HISTORY: She currently resides at [facility] home. No history of tobacco, alcohol or drug abuse.   FAMILY HISTORY: Patient's husband states no  history of colorectal cancer. There is a family history of breast cancer.   REVIEW OF SYSTEMS: Unable to obtain at this time due to patient confusion.   LABORATORY, DIAGNOSTIC AND RADIOLOGICAL DATA: Most recent lab work 05/17/2011: Serum glucose 75, BUN 9, creatinine 0.87, serum sodium 143, serum potassium 2.5, serum chloride 109. GFR greater than 60, calcium 7.1, A1c 6.5, WBC 17.1, hemoglobin 10.6, hematocrit 32.8, platelets 227, RDW 15.2, neutrophil 14.8. C. difficile positive. Blood cultures x2 and urine culture drawn on 01/29 no growth in 18 to 24 hours. Urinalysis negative. CT with normal liver, spleen, pancreas, adrenals; demonstrated diffuse colonic wall thickening, pancolitis. No free air or bowel obstruction.   PHYSICAL EXAMINATION:  VITAL SIGNS: Most recent vital signs: Temperature 98, pulse 94, respirations 16, blood pressure 104/57, oxygen saturation 95% on 2 liters O2.   GENERAL: Chronically ill appearing somewhat disheveled, elderly Caucasian woman in no acute distress, appears comfortable.   PSYCHIATRIC: Confused, follows a few commands.   HEENT: Atraumatic, normocephalic. Sclerae anicteric. Eyes without redness, drainage, or inflammation. Nose without redness, drainage, inflammation. Oral membranes pink and moist.   NECK: Supple. No thyromegaly or lymphadenopathy. No JVD.   RESPIRATORY: Respirations eupneic. Lungs essentially CTAB.   CARDIOVASCULAR: S1, S2. Regular rate and rhythm. No MRG. Radial pulses 2+ bilaterally. No edema.   GASTROINTESTINAL: Abdomen with hypoactive bowel sounds x4. Soft, nondistended, not overly tender. No hepatosplenomegaly, hernias, or peritoneal signs.   RECTAL: Deferred.   GENITOURINARY: Deferred.   SKIN: No erythema, lesion or rash, warm and dry, slightly pale.   EXTREMITIES: Warm, dry, pink with spontaneous movement x4. Some generalized weakness. No clubbing or cyanosis.   NEUROLOGICAL: Cranial nerves II through XII grossly intact.  Sensation appears normal. No focal neurological deficits noted.     ASSESSMENT AND PLAN: C. difficile colitis.  1. Will increase Flagyl 500 mg to q.i.d. Will also start Dificid 200 mg p.o. b.i.d. This may be open and sprinkled in a little bit of Gatorade for the patient to take.  2. Will add STAT magnesium level. Will also add serum protein immunoelectrophoresis to assess protein levels.  3. Recommend daily MET-B, CBC and GI will continue to follow.   These services were provided by Stephens November, MSN, [place] in collaboration with Gaylyn Cheers, M.D.   ____________________________ Theodore Demark, NP chl:cms D: 05/17/2011 13:26:04 ET T: 05/17/2011 13:49:52 ET JOB#: [id number]  cc: Theodore Demark, NP, <Dictator> [place] SIGNED 05/17/2011 19:19

## 2014-08-09 NOTE — Discharge Summary (Signed)
PATIENT NAME:  Veronica [name], Veronica [name] MR#:  [id number] DATE OF BIRTH:  Jun 16, 1929  DATE OF ADMISSION:  05/16/2011 DATE OF DISCHARGE:  05/24/2011  HISTORY OF PRESENT ILLNESS: Veronica [name] is a 81 year old white lady who had been residing at [facility] following surgery approximately two months ago for peptic ulcer disease. The patient apparently had had a stormy course and had been treated for diverticulitis at the nursing home. She developed nausea, vomiting, and diarrhea, and had poor intake. She was brought to the Emergency Room for further evaluation.   PAST MEDICAL HISTORY:  1. Severe dementia.  2. Chronic obstructive pulmonary disease.  3. Chronic depression. 4. Chronic anxiety. 5. Gastroesophageal reflux disease. 6. Peptic ulcer disease.   PAST SURGICAL HISTORY: Recent surgery by Dr. Michela Pitcher for peptic ulcer disease.   ALLERGIES: The patient has a history of intolerance to penicillin, sulfa, Cipro, and Avelox.   ADMISSION MEDICATIONS:  1. Xanax 0.5 mg every six hours p.r.n.  2. Risperdal 1 mg twice a day. 3. Albuterol 0.83% inhaler every six hours. 4. Citalopram 20 mg daily.  5. Protonix 40 mg daily.  6. Norvasc 10 mg daily.  7. Spiriva 1 puff daily.  8. Trazodone 50 mg twice a day. 9. Advair inhaler 250/50 inhaler one puff twice a day. 10. Risperdal 0.5 mg twice a day. 11. Remeron 15 mg at bedtime.   ADMISSION PHYSICAL EXAMINATION: Her examination revealed a temperature of 99.2, heart rate of 102, respiratory rate of 20, and a blood pressure 113/46. Pulse oximetry was 93% on room air. Admission physical examination, as described by the admitting physician, was basically unremarkable, as described in the admission note.   LABS/STUDIES: Admission CBC showed a hemoglobin of 12.6 with a hematocrit of 38.2. White count was 28,300. Platelet count was 280,000. Admission basic metabolic panel was notable for a potassium of 3.4 and a calcium of 7.5. The hypocalcemia however was subsequently noted to  be due to low albumin. Estimated GFR was 54. Lipid panel was within normal limits. Hemoglobin A1c was 6.5%. Magnesium was 1.4.   Urinalysis was within normal limits.   Blood cultures drawn on admission eventually showed no growth. Urine culture also showed no growth. Stool was positive for C. difficile toxin.   Serum protein immunoelectrophoresis showed a low total protein and albumin, but was otherwise unremarkable.   CT scan of the abdomen showed pancolitis.   HOSPITAL COURSE: The patient was admitted to the regular medical floor where she was rehydrated with IV fluids. She was also given supplements for her electrolyte imbalances. She was seen in consultation by gastroenterology and was placed on IV Flagyl plus IV Dificid.  On this regimen, the patient did gradually improve. Her mental status waxed and waned, but she was rarely very responsive. Great difficulty was noted during her entire hospitalization trying to get her to eat or to take her medications. Her abdomen actually remained relatively soft and nontender throughout the hospitalization. After completing her course of therapy, a follow-up CBC showed a hemoglobin of 10.4 with a hematocrit of 32.6. Platelet count was 338,000. White count was down to 6800. At the time of transfer, the patient is afebrile and has a soft abdomen. Her intake however remains poor. The patient was seen in consultation by palliative care. There was some discussion amongst the family about PEG tube placement for tube her feedings. Most of them however were against it after talking with myself and Dr. Mechele Collin. They are of the opinion they may be able to  get her to eat more once she gets back to [facility] skilled [facility] because her granddaughter works there. The patient's prognosis however remains poor and she is a NO CODE.  DISCHARGE DIAGNOSIS: Pancolitis secondary to Clostridium difficile.   DISCHARGE MEDICATIONS:  1. Tylenol 650 mg every four hours as needed.   2. Zofran 4 mg every six hours p.r.n.  3. Celexa 20 mg daily.  4. Norvasc 10 mg daily.  5. Spiriva 1 puff daily.  6. Trazodone 50 mg twice a day. 7. Advair inhaler 250/50 inhaler one puff twice a day.  8. Remeron 15 mg at bedtime.  9. Alprazolam 0.5 mg every six hours p.r.n.  10. Flora-Q one capsule twice a day. 11. Risperdal 1 mg twice a day.  12. Risperdal 1 mg every 6 hours p.r.n. severe agitation.  13. Protonix 40 mg daily.   DISCHARGE DISPOSITION: The patient is being discharged on a prudent diet with activity as tolerated. She will be reevaluated by physical therapy once back in the rehab facility.   PROGNOSIS: Poor.   CODE STATUS: NO CODE.  ____________________________ Letta PateJohn B. Danne HarborWalker III, MD jbw:slb D: 05/24/2011 12:59:04 ET T: 05/24/2011 13:29:29 ET JOB#: [id number]  cc: Jonny RuizJohn B. Danne HarborWalker III, MD, <Dictator> Elmo PuttJOHN B WALKER III MD ELECTRONICALLY SIGNED 05/25/2011 10:32
